# Patient Record
Sex: Male | Born: 1975 | Race: White | Hispanic: No | Smoking: Never smoker
Health system: Southern US, Community
[De-identification: ages and names within clinical notes are randomized; demographics above are authoritative.]

## PROBLEM LIST (undated history)

## (undated) DIAGNOSIS — S14109A Unspecified injury at unspecified level of cervical spinal cord, initial encounter: Secondary | ICD-10-CM

## (undated) HISTORY — PX: MYRINGOTOMY WITH TUBE PLACEMENT: SHX5663

## (undated) HISTORY — PX: ADENOIDECTOMY: SUR15

## (undated) HISTORY — PX: OTHER SURGICAL HISTORY: SHX169

## (undated) HISTORY — PX: KNEE ARTHROPLASTY: SHX992

## (undated) HISTORY — DX: Unspecified injury at unspecified level of cervical spinal cord, initial encounter: S14.109A

## (undated) HISTORY — PX: ANTERIOR FUSION CERVICAL SPINE: SUR626

## (undated) HISTORY — PX: VASECTOMY REVERSAL: SHX243

## (undated) HISTORY — PX: VASECTOMY: SHX75

---

## 2007-06-17 ENCOUNTER — Ambulatory Visit (HOSPITAL_COMMUNITY): Admission: RE | Admit: 2007-06-17 | Discharge: 2007-06-17 | Payer: Self-pay | Admitting: Orthopedic Surgery

## 2007-07-10 ENCOUNTER — Ambulatory Visit (HOSPITAL_COMMUNITY): Admission: RE | Admit: 2007-07-10 | Discharge: 2007-07-11 | Payer: Self-pay | Admitting: Orthopedic Surgery

## 2008-10-17 IMAGING — CR DG CHEST 2V
2 series · 2 of 2 positions shown · non-contrast
Comparison: None

CLINICAL DATA: Preop for HNP.
 CHEST ? 2 VIEW:

[w chest pa]
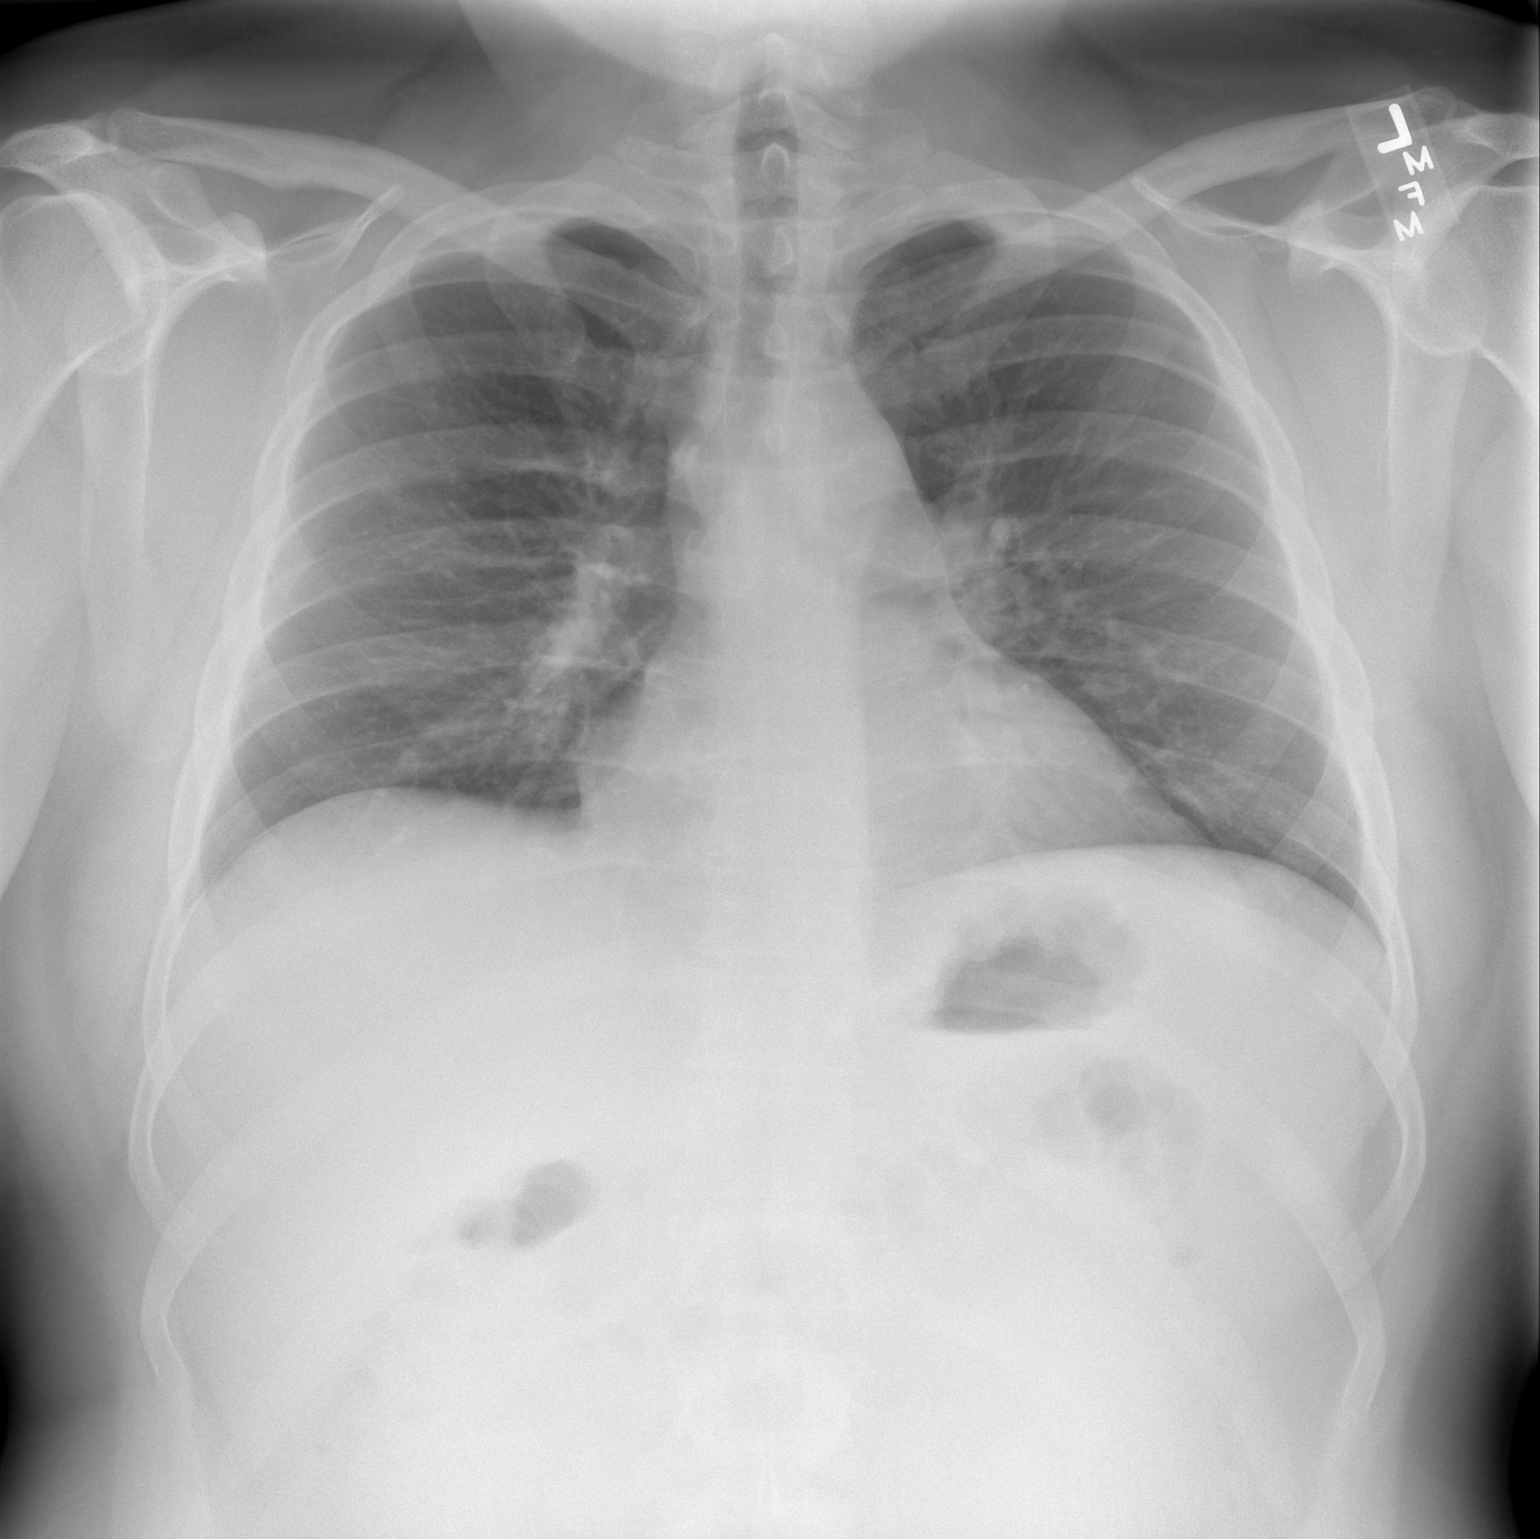

[w chest lat]
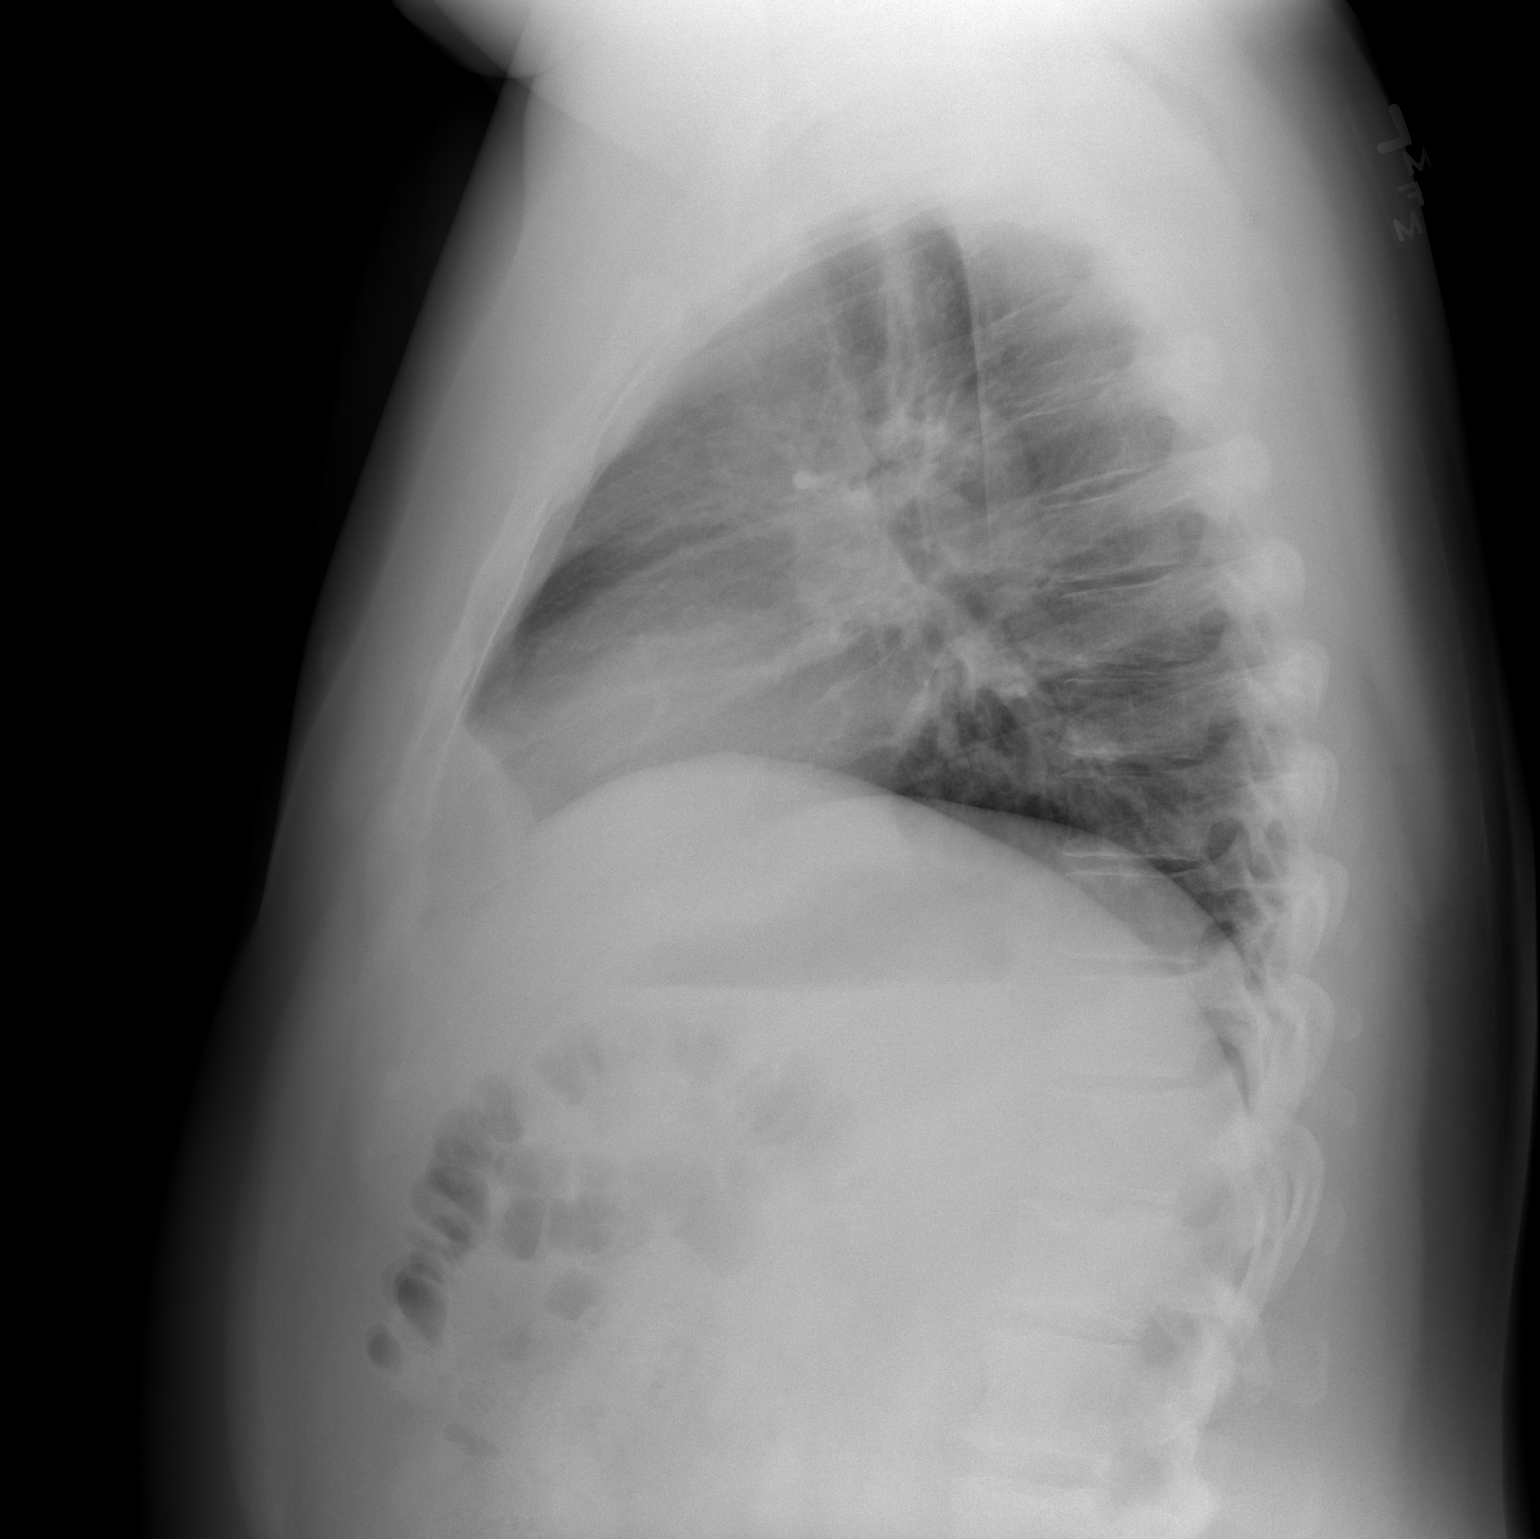

[2 of 2 positions shown; findings below may reference images not displayed]

FINDINGS: Suboptimal level of inspiration.  Heart and lungs normal.  Osseous structures intact.
IMPRESSION: Suboptimal inspiration ? no active disease.

## 2011-03-26 NOTE — Op Note (Signed)
NAME:  Terry Charles, Terry Charles              ACCOUNT NO.:  [id number]   MEDICAL RECORD NO.:  [id number]          PATIENT TYPE:  OIB   LOCATION:  1619                         FACILITY:  [facility]   PHYSICIAN:  Georges Lynch. Gioffre, M.D.DATE OF BIRTH:  Dec 05, 1975   DATE OF PROCEDURE:  07/10/2007  DATE OF DISCHARGE:  07/11/2007                               OPERATIVE REPORT   SURGEON:  Windy Fast A. Darrelyn Hillock, M.D.   ASSISTANT:  Jene Every, M.D.   PREOPERATIVE DIAGNOSIS:  1. 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  2. 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   POSTOPERATIVE DIAGNOSIS:  1. 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  2. 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   OPERATION:  1. Decompression of the lateral recess at L5-S1 on the right for      severe lateral recess stenosis.  2. Microdiscectomy at L5-S1 on the right for herniated disc.   PROCEDURE:  Under general anesthesia, routine orthopedic prep and drape  of the lower back carried out.  He had the appropriate dose of Ancef  preop.  At this time, two needles were placed in the back for  localization purposes and an x-ray was taken.  Following that, an  incision was made over the L5-S1 interspace.  Bleeders were identified  and cauterized.  At this time, we stripped the muscle from the lamina  and spinous processes bilaterally.  At this time, we inserted the  [facility] retractors and another x-ray was taken followed by a third x-  ray.  Once we localized the exact interspace, we carried out a  hemilaminectomy in the usual fashion at L5-S1 on the left.  Note, he had  an indentation in the dura from the large overgrowth of the facet and we  gently protected the dura with cottonoids, brought the microscope in,  and did a complete decompression of the lateral recess.  We then  cauterized lateral recess veins with a bipolar.  We then went up into  the disc space.  A cruciate incision was made  in the disc space and  discectomy was carried out.  We were able to easily get the midline of  the disc where the disc herniation originated. We went out there with  the Epstein curets, subligamentous, and dissected the disc out and then  went down into the space and completed discectomy.  When the procedure  was done, we were able to nicely move the dura and the nerve root.  The  foramina now was free.  We did a foraminotomy, as well, for the S1 root.  Note, we did not need to go in on the opposite side of the back at that  level.  His disc herniation with central and all to the right and the  stenosis was mainly on the right.  The majority was pain was in the  right leg, he had minimal on the left.  We thoroughly irrigated out the  area, loosely applied some thrombin soaked Gelfoam, and closed the wound  in layers in  the usual fashion.  I left a small portion of the deep distal part of  the wound open for drainage purposes.  Th skin was closed with metal  staples.  A Neosporin dressing was applied.  The patient left the  operating room in satisfactory position.           ______________________________  Georges Lynch Darrelyn Hillock, M.D.     RAG/MEDQ  D:  07/10/2007  T:  07/11/2007  Job:  [id number]

## 2011-08-23 LAB — TYPE AND SCREEN: Antibody Screen: NEGATIVE

## 2013-04-08 ENCOUNTER — Other Ambulatory Visit: Payer: Self-pay

## 2013-04-08 DIAGNOSIS — M25551 Pain in right hip: Secondary | ICD-10-CM

## 2013-04-11 ENCOUNTER — Other Ambulatory Visit: Payer: Self-pay

## 2013-04-15 ENCOUNTER — Ambulatory Visit
Admission: RE | Admit: 2013-04-15 | Discharge: 2013-04-15 | Disposition: A | Discharge status: Home / Self Care | Payer: Non-veteran care | Source: Ambulatory Visit

## 2013-04-15 DIAGNOSIS — M25551 Pain in right hip: Secondary | ICD-10-CM

## 2013-04-15 MED ORDER — GADOBENATE DIMEGLUMINE 529 MG/ML IV SOLN
20.0000 mL | Freq: Once | INTRAVENOUS | Status: AC | PRN
  Administered 2013-04-15: 20 mL via INTRAVENOUS

## 2018-04-12 DIAGNOSIS — S14109A Unspecified injury at unspecified level of cervical spinal cord, initial encounter: Secondary | ICD-10-CM | POA: Insufficient documentation

## 2018-11-22 DIAGNOSIS — A419 Sepsis, unspecified organism: Secondary | ICD-10-CM

## 2018-11-22 DIAGNOSIS — N179 Acute kidney failure, unspecified: Secondary | ICD-10-CM

## 2018-11-22 DIAGNOSIS — D72829 Elevated white blood cell count, unspecified: Secondary | ICD-10-CM

## 2018-11-22 DIAGNOSIS — N201 Calculus of ureter: Secondary | ICD-10-CM

## 2018-11-22 DIAGNOSIS — I959 Hypotension, unspecified: Secondary | ICD-10-CM

## 2018-11-22 DIAGNOSIS — G8250 Quadriplegia, unspecified: Secondary | ICD-10-CM

## 2018-11-22 DIAGNOSIS — I82412 Acute embolism and thrombosis of left femoral vein: Secondary | ICD-10-CM

## 2018-11-22 DIAGNOSIS — N390 Urinary tract infection, site not specified: Secondary | ICD-10-CM

## 2018-11-22 DIAGNOSIS — R509 Fever, unspecified: Secondary | ICD-10-CM

## 2018-11-23 DIAGNOSIS — Z9689 Presence of other specified functional implants: Secondary | ICD-10-CM

## 2018-11-23 DIAGNOSIS — Z960 Presence of urogenital implants: Secondary | ICD-10-CM

## 2018-11-23 DIAGNOSIS — R7881 Bacteremia: Secondary | ICD-10-CM

## 2018-11-23 DIAGNOSIS — N179 Acute kidney failure, unspecified: Secondary | ICD-10-CM | POA: Diagnosis not present

## 2018-11-24 DIAGNOSIS — Z960 Presence of urogenital implants: Secondary | ICD-10-CM | POA: Diagnosis not present

## 2018-11-24 DIAGNOSIS — R7881 Bacteremia: Secondary | ICD-10-CM | POA: Diagnosis not present

## 2018-11-24 DIAGNOSIS — N179 Acute kidney failure, unspecified: Secondary | ICD-10-CM | POA: Diagnosis not present

## 2018-11-24 DIAGNOSIS — Z9689 Presence of other specified functional implants: Secondary | ICD-10-CM | POA: Diagnosis not present

## 2021-01-24 ENCOUNTER — Telehealth: Payer: Self-pay | Admitting: Oncology

## 2021-01-24 NOTE — Telephone Encounter (Signed)
[facility] Referral for Chronic Embolism/Thrombosis.  Appt made for 02/13/21 Consult 11:00 am  Medical Records in [facility] Chart/Cancer Everywhere

## 2021-02-13 ENCOUNTER — Other Ambulatory Visit: Payer: Self-pay

## 2021-02-13 ENCOUNTER — Inpatient Hospital Stay: Payer: Non-veteran care | Attending: Oncology | Admitting: Oncology

## 2021-02-13 ENCOUNTER — Encounter: Payer: Self-pay | Admitting: Oncology

## 2021-02-13 DIAGNOSIS — I82411 Acute embolism and thrombosis of right femoral vein: Secondary | ICD-10-CM

## 2021-02-27 DIAGNOSIS — I82411 Acute embolism and thrombosis of right femoral vein: Secondary | ICD-10-CM | POA: Insufficient documentation

## 2021-02-27 NOTE — Progress Notes (Signed)
[facility]  [address] [place],  [place]  [id number] [phone]  Clinic Day:  02/13/2021  [facility]  HISTORY OF PRESENT ILLNESS:  The patient is a 45 y.o. male  who I was asked to consult upon for determining if prolonged anticoagulation remains necessary.  His history dates back to June 2019 when he had neck surgery.  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  He was essentially bed-confined from June to November 2019. He recalls noticing swelling in his right leg in September 2019, which led to a Doppler ultrasound being done, which revealed an extensive DVT in this extremity.  Since then, he has been taking Eliquis.  Although his mobility was initially very limited, the patient is now in a wheelchair that allows much greater movement and personal autonomy.  He denies having previous blood clots, even after undergoing 2 right knee surgeries in 2009.  As he is adopted, he does not know if there is a family history of clotting disorders.    PAST MEDICAL HISTORY:   Past Medical History:  Diagnosis Date  . Spinal cord injury, cervical region [facility](HCC)   Bipolar affective disorder Post-traumatic stress disorder Early stage colon cancer Hemorrrhoids  PAST SURGICAL HISTORY:  Partial colectomy V-P shunt surgery Neck decompression surgery 2 right knee surgeries Lower back surgery  CURRENT MEDICATIONS:   Current Outpatient Medications  Medication Sig Dispense Refill  . apixaban (ELIQUIS) 5 MG TABS tablet TAKE ONE TABLET BY MOUTH TWICE A DAY (CAUTION: BLOOD THINNER)    . baclofen (LIORESAL) 10 MG tablet Take 1 tablet by mouth 3 (three) times daily.    . busPIRone (BUSPAR) 10 MG tablet TAKE ONE TABLET BY MOUTH TWICE A DAY FOR ANXIETY    . cloNIDine (CATAPRES) 0.1 MG tablet Take by mouth.    . diclofenac Sodium (VOLTAREN) 1 % GEL APPLY 2 GRAMS TO AFFECTED AREA  THREE TIMES A DAY AS NEEDED FOR ELBOW PAIN    . diphenhydrAMINE (BENADRYL) 25 mg capsule Take by mouth.    . enoxaparin (LOVENOX) 40 MG/0.4ML injection Inject into the skin.    Marland Kitchen. ergocalciferol (VITAMIN D2) 1.25 MG (50000 UT) capsule TAKE ONE CAPSULE BY MOUTH EVERY 7 DAYS - STOP ALL OTHER VITAMIN D WHILE ON THIS MEDICATION    . Fluocinolone Acetonide Body 0.01 % OIL APPLY SMALL AMOUNT TO AFFECTED AREA ONCE A DAY AS NEEDED USE ONCE TO TWICE DAILY ON SCALING AREAS    . guaifenesin (HUMIBID E) 400 MG TABS tablet Take by mouth.    . hydrocortisone 2.5 % cream APPLY SMALL AMOUNT TO AFFECTED AREA TWICE A DAY AS NEEDED APPLY TO AFFECTED AREA ON SCALP ARE NEEDED FOR INCREASED REDNESS AND  SCALING -- DO NOT USE FOR LONGER THAN 5 DAYS AT A TIME AND FOR NO MORE  THAN 10 DAYS IN A MONTH APPLY TO AFFECTED AREA ON SCALP ARE NEEDED FOR INCREASED REDNESS AND  SCALING -- DO NOT USE FOR LONGER THAN 5 DAYS AT A TIME AND FOR NO MORE  THAN 10 DAYS IN A MONTH    . ipratropium-albuterol (DUONEB) 0.5-2.5 (3) MG/3ML SOLN Take 1 ampule by nebulization daily as needed.    Marland Kitchen. ketoconazole (NIZORAL) 2 % cream APPLY SMALL AMOUNT TO AFFECTED AREA TWICE A DAY AS NEEDED USE ON SCALING AREAS OF SKIN AS NEEDED    . lidocaine (XYLOCAINE) 2 % jelly APPLY SMALL AMOUNT TO AFFECTED AREA  DAILY WITH CATH    . lithium carbonate 300 MG capsule Take 1 capsule by mouth 2 (two) times daily.    . Magnesium Oxide 420 MG TABS TAKE TWO TABLETS BY MOUTH AT BEDTIME LOW MAGNESIUM    . melatonin 3 MG TABS tablet Take by mouth.    . methenamine (HIPREX) 1 g tablet Take 1 tablet by mouth every 12 (twelve) hours.    . mirtazapine (REMERON) 15 MG tablet Take 0.5 tablets by mouth at bedtime.    . Multiple Vitamin (THERA) TABS Take 1 tablet by mouth daily.    Marland Kitchen omeprazole (PRILOSEC) 20 MG capsule TAKE ONE CAPSULE BY MOUTH IN THE MORNING AS NEEDED BEFORE A MEAL FOR ACID REFLUX    . oxybutynin (DITROPAN-XL) 10 MG 24 hr tablet Take 1 tablet by mouth daily.    .  pregabalin (LYRICA) 100 MG capsule TAKE ONE CAPSULE BY MOUTH EVERY MORNING AND AT NOON AND TAKE TWO CAPSULES AT BEDTIME (PADR APPROVED FOR THREE TIMES A DAY DOSING)    . ranitidine (ZANTAC) 75 MG/5ML syrup Take by mouth.    . senna (SENOKOT) 8.6 MG tablet TAKE TWO TABLETS BY MOUTH IN THE MORNING BEFORE BREAKFAST    . sildenafil (VIAGRA) 100 MG tablet TAKE ONE-HALF TABLET BY MOUTH AS INSTRUCTED (TAKE 1 HOUR PRIOR TO SEXUAL ACTIVITY *DO NOT EXCEED 1 DOSE PER 24 HOUR PERIOD*)    . tiZANidine (ZANAFLEX) 4 MG tablet TAKE ONE TABLET BY MOUTH EVERY 8 HOURS AS NEEDED (TAKE ONE-HALF TO ONE TABLET EVERY 8 HOURS AS NEEDED FOR SPASMS)    . vitamin B-12 (CYANOCOBALAMIN) 500 MCG tablet TAKE ONE TABLET BY MOUTH DAILY FOR VITAMIN B12    . albuterol (ACCUNEB) 0.63 MG/3ML nebulizer solution every 6 hours as needed.    Marland Kitchen buPROPion (WELLBUTRIN) 100 MG tablet Take by mouth.    . cetirizine (ZYRTEC) 10 MG tablet Take by mouth.    . esomeprazole (NEXIUM) 20 MG capsule Take by mouth.    Marland Kitchen HYDROcodone-acetaminophen (NORCO/VICODIN) 5-325 MG tablet Take by mouth.    Marland Kitchen ibuprofen (ADVIL) 800 MG tablet Take by mouth.    . lamoTRIgine (LAMICTAL) 200 MG tablet Take by mouth.    . testosterone cypionate (DEPOTESTOSTERONE CYPIONATE) 200 MG/ML injection Inject into the muscle.    . zolpidem (AMBIEN) 10 MG tablet Take by mouth.     No current facility-administered medications for this visit.    ALLERGIES:   Allergies  Allergen Reactions  . Sulfamethoxazole Anaphylaxis  . Sulfur Anaphylaxis and Rash    Pt experienced throat swelling w bactrim.  Also possible macular-papular rash with S containing medications  . Gabapentin Rash and Nausea And Vomiting    Other reaction(s): Eruption, Finding of vomiting, GI intolerance  . 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 Methocarbamol     Other reaction(s): Other (see comments) Difficulty swallowing  and choking Difficulty swallowing and choking   . Naproxen Nausea Only and Nausea And Vomiting    Other reaction(s): GI intolerance    FAMILY HISTORY:   Family History  Adopted: Yes    SOCIAL HISTORY:  The patient was born in [place].  He lives in [place] .  He is divorced, with 6 children and 3 grandchildren.  He was a Naval architect for 20 years.  He was in the [employer]	[place] for 8 years.  There is no history of alcoholism or tobacco  abuse.   REVIEW OF SYSTEMS:  Review of Systems  Constitutional: Positive for fatigue. Negative for fever and unexpected weight change.  HENT:   Positive for tinnitus.   Respiratory: Positive for cough. Negative for chest tightness, hemoptysis and shortness of breath.   Cardiovascular: Negative for chest pain and palpitations.  Gastrointestinal: Positive for diarrhea and nausea. Negative for abdominal distention, abdominal pain, blood in stool, constipation and vomiting.  Genitourinary: Negative for dysuria, frequency and hematuria.   Musculoskeletal: Positive for arthralgias. Negative for back pain and myalgias.  Skin: Positive for rash (facial). Negative for itching.  Neurological: Negative for dizziness, headaches and light-headedness.  Psychiatric/Behavioral: Positive for depression. Negative for suicidal ideas. The patient is not nervous/anxious.      PHYSICAL EXAM:  Blood pressure 110/69, pulse 69, temperature 98.4 F (36.9 C), resp. rate 16, height 5\' 8"  (1.727 m), SpO2 97 %. Wt Readings from Last 3 Encounters:  No data found for Wt   There is no height or weight on file to calculate BMI. Performance status (ECOG): 1 - Symptomatic but completely ambulatory Physical Exam Constitutional:      Appearance: Normal appearance. He is not ill-appearing.     Comments: He is in a multi-postional, robotic wheelchair  HENT:     Mouth/Throat:     Mouth: Mucous membranes are moist.     Pharynx: Oropharynx is clear. No oropharyngeal exudate or  posterior oropharyngeal erythema.  Cardiovascular:     Rate and Rhythm: Normal rate and regular rhythm.     Heart sounds: No murmur heard. No friction rub. No gallop.   Pulmonary:     Effort: Pulmonary effort is normal. No respiratory distress.     Breath sounds: Normal breath sounds. No wheezing, rhonchi or rales.  Chest:  Breasts:     Right: No axillary adenopathy or supraclavicular adenopathy.     Left: No axillary adenopathy or supraclavicular adenopathy.    Abdominal:     General: Bowel sounds are normal. There is no distension.     Palpations: Abdomen is soft. There is no mass.     Tenderness: There is no abdominal tenderness.  Musculoskeletal:        General: No swelling.     Right lower leg: No edema.     Left lower leg: No edema.  Lymphadenopathy:     Cervical: No cervical adenopathy.     Upper Body:     Right upper body: No supraclavicular or axillary adenopathy.     Left upper body: No supraclavicular or axillary adenopathy.     Lower Body: No right inguinal adenopathy. No left inguinal adenopathy.  Skin:    General: Skin is warm.     Coloration: Skin is not jaundiced.     Findings: No lesion or rash.  Neurological:     General: No focal deficit present.     Mental Status: He is alert and oriented to person, place, and time. Mental status is at baseline.     Cranial Nerves: Cranial nerves are intact.  Psychiatric:        Mood and Affect: Mood normal.        Behavior: Behavior normal.        Thought Content: Thought content normal.    ASSESSMENT & PLAN:  A 45 y.o. male who I was asked to consult upon with respect to determining whether indefinite anticoagulation is necessary.  I definitely believe his right lower extremity DVT was due to the prolonged bed confinement he  has after the unfortunate aftermath of his 2019 neck surgery.  Both the patient and his daughter reassure me that his wheelchair is instrumental in maintaining his mobility.  Based upon this, I do  not believe this gentleman needs to stay on lifelong anticoagulation.  There is no need to do any type of hypercoagulable workup.  He can stop his Eliquis immediately.  However, the patient understands that if he develops another blood clot in the future, it would relegate him to lifelong anticoagulation.  As he has no other pressing hematologic issues, I do feel comfortable turning his care back over to his other physicians.  The patient understands all the plans discussed today and is in agreement with them.  [facility].   Kerrington Greenhalgh Kirby Funk, MD
